# Patient Record
Sex: Female | Born: 1965 | Race: Black or African American | Hispanic: No | Marital: Single | Smoking: Never smoker
Health system: Southern US, Community
[De-identification: ages and names within clinical notes are randomized; demographics above are authoritative.]

## PROBLEM LIST (undated history)

## (undated) DIAGNOSIS — E119 Type 2 diabetes mellitus without complications: Secondary | ICD-10-CM

## (undated) DIAGNOSIS — I10 Essential (primary) hypertension: Secondary | ICD-10-CM

## (undated) DIAGNOSIS — E039 Hypothyroidism, unspecified: Secondary | ICD-10-CM

## (undated) HISTORY — DX: Type 2 diabetes mellitus without complications: E11.9

## (undated) HISTORY — DX: Essential (primary) hypertension: I10

## (undated) HISTORY — DX: Hypothyroidism, unspecified: E03.9

---

## 1997-11-14 ENCOUNTER — Other Ambulatory Visit: Admission: RE | Admit: 1997-11-14 | Discharge: 1997-11-14 | Payer: Self-pay | Admitting: Internal Medicine

## 1999-06-24 ENCOUNTER — Encounter: Admission: RE | Admit: 1999-06-24 | Discharge: 1999-06-24 | Payer: Self-pay | Admitting: Internal Medicine

## 1999-06-24 ENCOUNTER — Encounter: Payer: Self-pay | Admitting: Internal Medicine

## 2000-02-18 ENCOUNTER — Other Ambulatory Visit: Admission: RE | Admit: 2000-02-18 | Discharge: 2000-02-18 | Payer: Self-pay | Admitting: Internal Medicine

## 2004-09-07 ENCOUNTER — Encounter: Admission: RE | Admit: 2004-09-07 | Discharge: 2004-09-07 | Payer: Self-pay | Admitting: Internal Medicine

## 2004-09-21 ENCOUNTER — Encounter: Admission: RE | Admit: 2004-09-21 | Discharge: 2004-09-21 | Payer: Self-pay | Admitting: Internal Medicine

## 2005-09-23 ENCOUNTER — Encounter: Admission: RE | Admit: 2005-09-23 | Discharge: 2005-09-23 | Payer: Self-pay | Admitting: Internal Medicine

## 2006-09-27 ENCOUNTER — Encounter: Admission: RE | Admit: 2006-09-27 | Discharge: 2006-09-27 | Payer: Self-pay | Admitting: Internal Medicine

## 2007-10-02 ENCOUNTER — Encounter: Admission: RE | Admit: 2007-10-02 | Discharge: 2007-10-02 | Payer: Self-pay | Admitting: Internal Medicine

## 2008-10-03 ENCOUNTER — Encounter: Admission: RE | Admit: 2008-10-03 | Discharge: 2008-10-03 | Payer: Self-pay | Admitting: Internal Medicine

## 2009-08-25 ENCOUNTER — Encounter (HOSPITAL_COMMUNITY): Admission: RE | Admit: 2009-08-25 | Discharge: 2009-11-23 | Payer: Self-pay | Admitting: Internal Medicine

## 2009-08-26 ENCOUNTER — Ambulatory Visit (HOSPITAL_COMMUNITY): Admission: RE | Admit: 2009-08-26 | Discharge: 2009-08-26 | Payer: Self-pay | Admitting: Specialist

## 2009-10-29 ENCOUNTER — Ambulatory Visit: Payer: Self-pay | Admitting: Obstetrics and Gynecology

## 2010-02-06 ENCOUNTER — Encounter: Admission: RE | Admit: 2010-02-06 | Discharge: 2010-02-06 | Payer: Self-pay | Admitting: Specialist

## 2010-08-14 ENCOUNTER — Ambulatory Visit (HOSPITAL_COMMUNITY)
Admission: RE | Admit: 2010-08-14 | Discharge: 2010-08-14 | Payer: Self-pay | Source: Home / Self Care | Attending: Specialist | Admitting: Specialist

## 2010-08-30 ENCOUNTER — Encounter: Payer: Self-pay | Admitting: Specialist

## 2010-12-31 ENCOUNTER — Ambulatory Visit: Payer: Self-pay | Admitting: Advanced Practice Midwife

## 2010-12-31 ENCOUNTER — Ambulatory Visit: Payer: Medicare Other | Admitting: Physician Assistant

## 2010-12-31 ENCOUNTER — Other Ambulatory Visit: Payer: Self-pay | Admitting: Obstetrics and Gynecology

## 2010-12-31 ENCOUNTER — Other Ambulatory Visit: Payer: Self-pay | Admitting: Physician Assistant

## 2010-12-31 DIAGNOSIS — E079 Disorder of thyroid, unspecified: Secondary | ICD-10-CM

## 2010-12-31 DIAGNOSIS — Z1231 Encounter for screening mammogram for malignant neoplasm of breast: Secondary | ICD-10-CM

## 2010-12-31 DIAGNOSIS — Z01419 Encounter for gynecological examination (general) (routine) without abnormal findings: Secondary | ICD-10-CM

## 2011-01-01 NOTE — Group Therapy Note (Signed)
Rachel Michael, Rachel Michael               ACCOUNT NO.:  [id number]  MEDICAL RECORD NO.:  [id number]           PATIENT TYPE:  A  LOCATION:  [facility]                   FACILITY:  [facility]  PHYSICIAN:  Maylon Cos, CNM    DATE OF BIRTH:  1965-08-28  DATE OF SERVICE:  12/31/2010                                 CLINIC NOTE  The patient is being seen at GYN Clinic at [facility].  REASON FOR TODAY'S VISIT:  Annual exam.  HISTORY OF PRESENT ILLNESS:  The patient is a 45 year old nulligravida patient who presents as a new patient to the GYN Clinic.  She is mentally challenged and presents with no complaints.  She is a very poor historian and the best that I can conclude she is under the treatment of Dr. Kemper Durie as her primary care provider.  She does live at a group home. The patient does state that she has family history positive for breast cancer in her mother and her maternal aunt.  Her problem list includes: 1. Thyroid disease. 2. Mental illness.  PHYSICAL EXAMINATION:  GENERAL:  Rachel Michael is a pleasant African American female who is in no distress. HEENT:  Grossly normal except for poor dentition.  She has no thyromegaly. BREASTS:  Symmetrical without dimpling or retracting of the skin.  No masses or lesions externally on the skin.  Nipples are erect without discharge.  No palpable lesions or masses. ABDOMEN:  Soft and nontender.  No hepatosplenomegaly. GENITALIA:  She has a small introitus, smooth, and slightly atrophied. Vaginal mucosa without rugae.  She has moderate tone.  Scant amount of discharge without odor.  She has nulliparous cervix that is nonfriable. Bimanual exam is limited by patient habitus.  She has no cervical motion tenderness.  Uterus is not enlarged, nontender.  Adnexa not enlarged, nontender. LOWER EXTREMITIES:  Slightly edematous, however, nontender and are reactive.  ASSESSMENT: 1. Well-born exam. 2. Mental delays. 3. Thyroid disease.  PLAN: 1. Pap smear  was obtained and sent to pathology per routine. 2. Routine screening mammogram ordered along with DEXA scan. 3. The patient should follow up with her primary care provider Dr.     Kemper Durie per routine.  She does state that she has a physical exam     with him in June.  The patient shall return here 12 months for a     physical or sooner if need be. 4. We will attempt to obtain records from her either group home and/or     primary provider Dr. Kemper Durie to help shed light on her medical     history.          ______________________________ Maylon Cos, CNM    SS/MEDQ  D:  12/31/2010  T:  01/01/2011  Job:  [phone]

## 2011-01-12 ENCOUNTER — Ambulatory Visit (HOSPITAL_COMMUNITY): Admission: RE | Admit: 2011-01-12 | Payer: Medicare Other | Source: Ambulatory Visit

## 2011-01-12 ENCOUNTER — Ambulatory Visit (HOSPITAL_COMMUNITY): Payer: Medicare Other | Attending: Obstetrics and Gynecology

## 2011-02-23 ENCOUNTER — Other Ambulatory Visit: Payer: Self-pay | Admitting: Physician Assistant

## 2011-02-23 ENCOUNTER — Other Ambulatory Visit: Payer: Self-pay | Admitting: Obstetrics and Gynecology

## 2011-02-23 ENCOUNTER — Ambulatory Visit (HOSPITAL_COMMUNITY)
Admission: RE | Admit: 2011-02-23 | Discharge: 2011-02-23 | Disposition: A | Discharge status: Home / Self Care | Payer: Medicare Other | Source: Ambulatory Visit | Attending: Obstetrics and Gynecology | Admitting: Obstetrics and Gynecology

## 2011-02-23 DIAGNOSIS — E079 Disorder of thyroid, unspecified: Secondary | ICD-10-CM

## 2011-02-23 DIAGNOSIS — Z13820 Encounter for screening for osteoporosis: Secondary | ICD-10-CM | POA: Insufficient documentation

## 2011-11-26 ENCOUNTER — Other Ambulatory Visit: Payer: Self-pay | Admitting: Specialist

## 2011-11-26 DIAGNOSIS — Z1231 Encounter for screening mammogram for malignant neoplasm of breast: Secondary | ICD-10-CM

## 2011-12-17 ENCOUNTER — Ambulatory Visit
Admission: RE | Admit: 2011-12-17 | Discharge: 2011-12-17 | Disposition: A | Discharge status: Home / Self Care | Payer: Medicare Other | Source: Ambulatory Visit | Attending: Specialist | Admitting: Specialist

## 2011-12-17 DIAGNOSIS — Z1231 Encounter for screening mammogram for malignant neoplasm of breast: Secondary | ICD-10-CM

## 2012-12-11 ENCOUNTER — Other Ambulatory Visit: Payer: Self-pay | Admitting: Family Medicine

## 2012-12-11 DIAGNOSIS — Z1231 Encounter for screening mammogram for malignant neoplasm of breast: Secondary | ICD-10-CM

## 2013-01-16 ENCOUNTER — Ambulatory Visit: Payer: Medicare Other

## 2013-05-02 ENCOUNTER — Other Ambulatory Visit: Payer: Self-pay | Admitting: Obstetrics and Gynecology

## 2013-05-11 ENCOUNTER — Other Ambulatory Visit: Payer: Self-pay | Admitting: Obstetrics and Gynecology

## 2013-07-23 ENCOUNTER — Ambulatory Visit: Payer: Medicare Other

## 2013-08-28 ENCOUNTER — Ambulatory Visit
Admission: RE | Admit: 2013-08-28 | Discharge: 2013-08-28 | Disposition: A | Discharge status: Home / Self Care | Payer: Medicare Other | Source: Ambulatory Visit | Attending: Family Medicine | Admitting: Family Medicine

## 2013-08-28 DIAGNOSIS — Z1231 Encounter for screening mammogram for malignant neoplasm of breast: Secondary | ICD-10-CM

## 2014-10-15 ENCOUNTER — Other Ambulatory Visit: Payer: Self-pay

## 2014-10-15 DIAGNOSIS — Z1231 Encounter for screening mammogram for malignant neoplasm of breast: Secondary | ICD-10-CM

## 2014-10-31 ENCOUNTER — Ambulatory Visit: Payer: Medicare Other

## 2014-11-04 ENCOUNTER — Ambulatory Visit
Admission: RE | Admit: 2014-11-04 | Discharge: 2014-11-04 | Disposition: A | Discharge status: Home / Self Care | Payer: Medicare Other | Source: Ambulatory Visit

## 2014-11-04 DIAGNOSIS — Z1231 Encounter for screening mammogram for malignant neoplasm of breast: Secondary | ICD-10-CM

## 2016-02-17 ENCOUNTER — Other Ambulatory Visit: Payer: Self-pay | Admitting: Family Medicine

## 2016-02-17 DIAGNOSIS — Z1231 Encounter for screening mammogram for malignant neoplasm of breast: Secondary | ICD-10-CM

## 2016-03-15 ENCOUNTER — Ambulatory Visit: Payer: Medicare Other

## 2016-03-24 ENCOUNTER — Ambulatory Visit
Admission: RE | Admit: 2016-03-24 | Discharge: 2016-03-24 | Disposition: A | Discharge status: Home / Self Care | Payer: Medicare Other | Source: Ambulatory Visit | Attending: Family Medicine | Admitting: Family Medicine

## 2016-03-24 DIAGNOSIS — Z1231 Encounter for screening mammogram for malignant neoplasm of breast: Secondary | ICD-10-CM

## 2016-09-27 ENCOUNTER — Other Ambulatory Visit: Payer: Self-pay | Admitting: Family Medicine

## 2016-09-27 DIAGNOSIS — Z1231 Encounter for screening mammogram for malignant neoplasm of breast: Secondary | ICD-10-CM

## 2017-03-30 ENCOUNTER — Ambulatory Visit: Payer: Medicare Other

## 2017-04-14 ENCOUNTER — Ambulatory Visit
Admission: RE | Admit: 2017-04-14 | Discharge: 2017-04-14 | Disposition: A | Discharge status: Home / Self Care | Payer: Medicare Other | Source: Ambulatory Visit | Attending: Family Medicine | Admitting: Family Medicine

## 2017-04-14 DIAGNOSIS — Z1231 Encounter for screening mammogram for malignant neoplasm of breast: Secondary | ICD-10-CM

## 2017-12-29 ENCOUNTER — Encounter: Payer: Self-pay | Admitting: Family Medicine

## 2018-03-13 ENCOUNTER — Other Ambulatory Visit: Payer: Self-pay | Admitting: Family Medicine

## 2018-03-13 ENCOUNTER — Other Ambulatory Visit: Payer: Self-pay | Admitting: Physician Assistant

## 2018-03-13 DIAGNOSIS — Z1231 Encounter for screening mammogram for malignant neoplasm of breast: Secondary | ICD-10-CM

## 2018-04-17 ENCOUNTER — Ambulatory Visit
Admission: RE | Admit: 2018-04-17 | Discharge: 2018-04-17 | Disposition: A | Discharge status: Home / Self Care | Payer: Medicare Other | Source: Ambulatory Visit | Attending: Physician Assistant | Admitting: Physician Assistant

## 2018-04-17 DIAGNOSIS — Z1231 Encounter for screening mammogram for malignant neoplasm of breast: Secondary | ICD-10-CM

## 2019-02-14 ENCOUNTER — Other Ambulatory Visit: Payer: Self-pay | Admitting: Physician Assistant

## 2019-02-14 DIAGNOSIS — Z1231 Encounter for screening mammogram for malignant neoplasm of breast: Secondary | ICD-10-CM

## 2019-04-12 ENCOUNTER — Other Ambulatory Visit: Payer: Self-pay

## 2019-04-12 ENCOUNTER — Ambulatory Visit: Payer: Medicare Other | Attending: Sports Medicine | Admitting: Occupational Therapy

## 2019-04-12 DIAGNOSIS — M79642 Pain in left hand: Secondary | ICD-10-CM | POA: Insufficient documentation

## 2019-04-12 DIAGNOSIS — M6281 Muscle weakness (generalized): Secondary | ICD-10-CM

## 2019-04-12 DIAGNOSIS — M25642 Stiffness of left hand, not elsewhere classified: Secondary | ICD-10-CM | POA: Diagnosis not present

## 2019-04-12 DIAGNOSIS — R278 Other lack of coordination: Secondary | ICD-10-CM | POA: Insufficient documentation

## 2019-04-12 NOTE — Therapy (Signed)
[facility] [address] [place], [place], [address] Phone: [phone]   Fax:  [phone]  Occupational Therapy Evaluation  Patient Details  Name: Rachel Michael MRN: [id number] Date of Birth: [birth year] Referring Provider (OT): Dr. Pati GalloJames Kramer   Encounter Date: 04/12/2019  OT End of Session - 04/12/19 1525    Visit Number  1    Number of Visits  9    Date for OT Re-Evaluation  05/12/19    Authorization Type  MCR/MCD    Authorization - Visit Number  1    Authorization - Number of Visits  10    OT Start Time  1145    OT Stop Time  1230    OT Time Calculation (min)  45 min    Activity Tolerance  Patient tolerated treatment well    Behavior During Therapy  [facility] for tasks assessed/performed       No past medical history on file.  No past surgical history on file.  There were no vitals filed for this visit.  Subjective Assessment - 04/12/19 1154    Pertinent History  Lt 5th mallet finger injury (with splinting) May 2020. PMH: Mild MR, DM, HTN, anemia    Limitations  none at this time    Currently in Pain?  Yes    Pain Location  --   small finger   Pain Orientation  Left    Pain Descriptors / Indicators  Aching    Pain Type  Acute pain    Pain Onset  More than a month ago    Pain Frequency  Intermittent    Aggravating Factors   at night    Pain Relieving Factors  massage, heat        [facility] OT Assessment - 04/12/19 0001      Assessment   Medical Diagnosis  Lt 5th mallet finger    Referring Provider (OT)  Dr. Pati GalloJames Kramer    Onset Date/Surgical Date  --   May 2020   Hand Dominance  Left   for writing     Precautions   Precautions  None      Balance Screen   Has the patient fallen in the past 6 months  Yes    How many times?  1    Has the patient had a decrease in activity level because of a fear of falling?   No    Is the patient reluctant to leave their home because of a fear of falling?   No       Home  Environment   Additional Comments  Pt lives on 2nd floor apartment (Independent Living)    Lives With  Alone      Prior Function   Level of Independence  Independent    Vocation  Volunteer work   at [employer] - takes [employer]     ADL   Eating/Feeding  Modified independent   with Rt hand (always)    Grooming  Modified independent    Upper Body Bathing  Modified independent    Lower Body Bathing  Modified independent    Upper Body Dressing  Independent    Lower Body Dressing  Modified independent    Toilet Transfer  Modified independent    Tub/Shower Transfer  Modified independent   question safety - may need grab bar or shower chair     IADL   Shopping  Needs to be accompanied on any shopping trip  Light Housekeeping  Does personal laundry completely;Performs light daily tasks such as dishwashing, bed making    Meal Prep  Able to complete simple warm meal prep;Able to complete simple cold meal and snack prep    Teacher, adult education independently on public transportation   SCAT, has personal aide come 1x/wk for groceries   Medication Management  Is responsible for taking medication in correct dosages at correct time   but question accuracy w/ this      Mobility   Mobility Status  Independent      Written Expression   Dominant Hand  Left   for writing only   Handwriting  Increased time   90% legibility d/t decr ring & small finger ROM     Vision - History   Baseline Vision  Wears glasses all the time      Cognition   Cognition Comments  Pt with mild MR (per MD note)       Observation/Other Assessments   Observations  Lt small finger at rest: PIP hyperextension and DIP flexion 30*       Sensation   Additional Comments  appears intact      Coordination   9 Hole Peg Test  --    Coordination  Pt can pick up pen and turn around in Lt hand b/t first 3 fingers. Pt unable to perform full in hand manipulation Lt hand hand due to decreased ring and small  finger ROM      Edema   Edema  none      ROM / Strength   AROM / PROM / Strength  AROM      AROM   Overall AROM Comments  BUE AROM WNL's except Lt ring and small finger - see below for details      Left Hand AROM   L Ring PIP 0-100  80 Degrees    L Ring DIP 0-70  35 Degrees    L Little PIP 0-100  40 Degrees   hyperextension at rest   L Little DIP 0-70  65 Degrees   -30 ext     Hand Function   Right Hand Grip (lbs)  37 lbs    Left Hand Grip (lbs)  19 lbs                           OT Long Term Goals - 04/12/19 1530      OT LONG TERM GOAL #1   Title  Independent with HEP for Lt hand    Time  4    Period  Weeks    Status  New      OT LONG TERM GOAL #2   Title  Pt to write name with greater ease/legibility using A/E prn    Time  4    Period  Weeks    Status  New      OT LONG TERM GOAL #3   Title  Grip strength to improve by 10 lbs or greater Lt hand    Baseline  19 lbs (Rt = 37 lbs)    Time  4    Period  Weeks    Status  New      OT LONG TERM GOAL #4   Title  Pt to improve PIP flexion Lt ring and small fingers by 10* or greater for greater composite flexion for gripping activities    Baseline  ring PIP flex = 80*, small  PIP flex = 40*    Time  4    Period  Weeks    Status  New      OT LONG TERM GOAL #5   Title  Pt to verbalize understanding with acquisition of DME for safety with bathing    Time  4    Period  Weeks    Status  New            Plan - 04/12/19 1526    Clinical Impression Statement  Pt is a 53 y.o. female who presents to outpatient rehab s/p Lt 5th digit mallet finger injury in May 2020. Pt did not have surgery but had splinting prior to this O.T. evaluation. MD referral states she does not need any further splinting at this time. Pt demo Lt small finger PIP hyperextension and DIP flexion in 30 degrees. Pt also very stiff in ring and small finger Lt hand and would benefit from O.T. to increase ROM, strength, and functional  use of Lt hand.    Occupational performance deficits (Please refer to evaluation for details):  ADL's;IADL's    Body Structure / Function / Physical Skills  ADL;IADL;ROM;Sensation;Body mechanics;Strength;Coordination;Pain;UE functional use    Rehab Potential  Good    Clinical Decision Making  Limited treatment options, no task modification necessary    Comorbidities Affecting Occupational Performance:  Presence of comorbidities impacting occupational performance    Comorbidities impacting occupational performance description:  MR    Modification or Assistance to Complete Evaluation   Min-Moderate modification of tasks or assist with assess necessary to complete eval    OT Frequency  2x / week    OT Duration  4 weeks   PLUS EVAL   OT Treatment/Interventions  Self-care/ADL training;Therapeutic exercise;Functional Mobility Training;Manual Therapy;Splinting;Therapeutic activities;DME and/or AE instruction;Paraffin;Electrical Stimulation;Fluidtherapy;Moist Heat;Contrast Bath;Passive range of motion;Patient/family education    Plan  Paraffin, HEP including P/ROM and putty    Consulted and Agree with Plan of Care  Patient       Patient will benefit from skilled therapeutic intervention in order to improve the following deficits and impairments:   Body Structure / Function / Physical Skills: ADL, IADL, ROM, Sensation, Body mechanics, Strength, Coordination, Pain, UE functional use       Visit Diagnosis: Stiffness of left hand, not elsewhere classified  Pain in left hand  Muscle weakness (generalized)  Other lack of coordination    Problem List There are no active problems to display for this patient.   Carey Bullocks, OTR/L 04/12/2019, 6:56 PM  [facility] [address] [place], [place], [address] Phone: [phone]   Fax:  [phone]  Name: Rachel Michael MRN: [id number] Date of Birth: [birth year]

## 2019-04-19 ENCOUNTER — Other Ambulatory Visit: Payer: Self-pay

## 2019-04-19 ENCOUNTER — Ambulatory Visit
Admission: RE | Admit: 2019-04-19 | Discharge: 2019-04-19 | Disposition: A | Discharge status: Home / Self Care | Payer: Medicare Other | Source: Ambulatory Visit | Attending: Physician Assistant | Admitting: Physician Assistant

## 2019-04-19 DIAGNOSIS — Z1231 Encounter for screening mammogram for malignant neoplasm of breast: Secondary | ICD-10-CM

## 2019-04-24 ENCOUNTER — Ambulatory Visit: Payer: Medicare Other | Admitting: Occupational Therapy

## 2019-04-24 ENCOUNTER — Other Ambulatory Visit: Payer: Self-pay

## 2019-04-24 DIAGNOSIS — M79642 Pain in left hand: Secondary | ICD-10-CM

## 2019-04-24 DIAGNOSIS — M6281 Muscle weakness (generalized): Secondary | ICD-10-CM

## 2019-04-24 DIAGNOSIS — M25642 Stiffness of left hand, not elsewhere classified: Secondary | ICD-10-CM

## 2019-04-24 NOTE — Patient Instructions (Signed)
   1. PIP Flexion (Active)    Keeping large knuckles straight, bend the middle joint of all fingers, as far as possible. Hold __3__ seconds. Repeat __10__ times. Do __6__ sessions per day.  2. PIP / DIP Composite Flexion (Passive Stretch)    Use other hand to bend middle and tip joints of ___small___ finger, and also ring finger. Hold _10___ seconds. Repeat __5__ times. Do __6__ sessions per day.   3.  Pen rolling exercise (use High lighter): roll high lighter down to palm and back up x 5 reps. Perform 6x/day    1. Grip Strengthening (Resistive Putty)   Squeeze putty using thumb and all fingers. Repeat _20___ times. Do __2__ sessions per day.   2.  IP Fisting (Resistive Putty)    Keeping big knuckles straight, bend fingertips only to squeeze putty. Keep pushing putty up towards fingertips between each repetition Repeat __10__ times. Do _2___ sessions per day.

## 2019-04-24 NOTE — Therapy (Signed)
[facility] [address] [place], [place], [address] Phone: [phone]   Fax:  [phone]  Occupational Therapy Treatment  Patient Details  Name: Rachel Michael MRN: [id number] Date of Birth: 1966/05/07 Referring Provider (OT): Dr. Pati GalloJames Kramer   Encounter Date: 04/24/2019  OT End of Session - 04/24/19 0842    Visit Number  2    Number of Visits  9    Date for OT Re-Evaluation  05/12/19    Authorization Type  MCR/MCD    Authorization - Visit Number  2    Authorization - Number of Visits  10    OT Start Time  0800    OT Stop Time  0840    OT Time Calculation (min)  40 min    Activity Tolerance  Patient tolerated treatment well    Behavior During Therapy  [facility] for tasks assessed/performed       No past medical history on file.  No past surgical history on file.  There were no vitals filed for this visit.  Subjective Assessment - 04/24/19 0809    Subjective   I forgot to bring my list of medications    Pertinent History  Lt 5th mallet finger injury (with splinting) May 2020. PMH: *Mild MR, DM, HTN, anemia    Limitations  none at this time. MD reports no need for splinting at this time as pt has already had splinting    Currently in Pain?  Yes    Pain Score  --   fluctuates   Pain Location  --   small finger   Pain Orientation  Left    Pain Descriptors / Indicators  Aching    Pain Type  Acute pain    Pain Onset  More than a month ago    Pain Frequency  Intermittent    Aggravating Factors   at night    Pain Relieving Factors  massage, heat                   OT Treatments/Exercises (OP) - 04/24/19 0001      Exercises   Exercises  Hand      Hand Exercises   Other Hand Exercises  Pt issued A/ROM, P/ROM, and putty HEP - see pt instructions for details. Pt issued yellow putty      Modalities   Modalities  Paraffin      LUE Paraffin   Number Minutes Paraffin  10 Minutes    LUE Paraffin  Location  Hand;Wrist             OT Education - 04/24/19 0835    Education Details  A/ROM, P/ROM and putty HEP for Lt hand/small finger    Person(s) Educated  Patient    Methods  Explanation;Demonstration;Handout;Verbal cues    Comprehension  Verbalized understanding;Returned demonstration;Verbal cues required;Need further instruction          OT Long Term Goals - 04/24/19 0845      OT LONG TERM GOAL #1   Title  Independent with HEP for Lt hand    Time  4    Period  Weeks    Status  On-going      OT LONG TERM GOAL #2   Title  Pt to write name with greater ease/legibility using A/E prn    Time  4    Period  Weeks    Status  New      OT LONG TERM GOAL #3  Title  Grip strength to improve by 10 lbs or greater Lt hand    Baseline  19 lbs (Rt = 37 lbs)    Time  4    Period  Weeks    Status  New      OT LONG TERM GOAL #4   Title  Pt to improve PIP flexion Lt ring and small fingers by 10* or greater for greater composite flexion for gripping activities    Baseline  ring PIP flex = 80*, small PIP flex = 40*    Time  4    Period  Weeks    Status  New      OT LONG TERM GOAL #5   Title  Pt to verbalize understanding with acquisition of DME for safety with bathing    Time  4    Period  Weeks    Status  New            Plan - 04/24/19 1209    Clinical Impression Statement  Pt progressing with LTG #1 - pt will need review    Occupational performance deficits (Please refer to evaluation for details):  ADL's;IADL's    Body Structure / Function / Physical Skills  ADL;IADL;ROM;Sensation;Body mechanics;Strength;Coordination;Pain;UE functional use    Rehab Potential  Good    Comorbidities impacting occupational performance description:  MR    OT Frequency  2x / week    OT Duration  4 weeks    OT Treatment/Interventions  Self-care/ADL training;Therapeutic exercise;Functional Mobility Training;Manual Therapy;Splinting;Therapeutic activities;DME and/or AE  instruction;Paraffin;Electrical Stimulation;Fluidtherapy;Moist Heat;Contrast Bath;Passive range of motion;Patient/family education    Plan  paraffin, review HEP, update med list if pt brings in, continue ROM and strength Lt hand/small finger    Consulted and Agree with Plan of Care  Patient       Patient will benefit from skilled therapeutic intervention in order to improve the following deficits and impairments:   Body Structure / Function / Physical Skills: ADL, IADL, ROM, Sensation, Body mechanics, Strength, Coordination, Pain, UE functional use       Visit Diagnosis: Stiffness of left hand, not elsewhere classified  Pain in left hand  Muscle weakness (generalized)    Problem List There are no active problems to display for this patient.   Carey Bullocks, OTR/L 04/24/2019, 12:11 PM  [facility] [address] [place], [place], [address] Phone: [phone]   Fax:  [phone]  Name: Rachel Michael MRN: [id number] Date of Birth: 1965/12/02

## 2019-04-26 ENCOUNTER — Ambulatory Visit: Payer: Medicare Other | Admitting: Occupational Therapy

## 2019-04-26 ENCOUNTER — Other Ambulatory Visit: Payer: Self-pay

## 2019-04-26 DIAGNOSIS — M25642 Stiffness of left hand, not elsewhere classified: Secondary | ICD-10-CM

## 2019-04-26 DIAGNOSIS — M79642 Pain in left hand: Secondary | ICD-10-CM

## 2019-04-26 DIAGNOSIS — M6281 Muscle weakness (generalized): Secondary | ICD-10-CM

## 2019-04-26 NOTE — Therapy (Signed)
[facility] [address] [place], [place], [address] Phone: [phone]   Fax:  [phone]  Occupational Therapy Treatment  Patient Details  Name: Rachel Michael MRN: [id number] Date of Birth: 12-28-1965 Referring Provider (OT): Dr. Berle Mull   Encounter Date: 04/26/2019  OT End of Session - 04/26/19 1603    Visit Number  3    Number of Visits  9    Date for OT Re-Evaluation  05/12/19    Authorization Type  MCR/MCD    Authorization - Visit Number  3    Authorization - Number of Visits  10    OT Start Time  5427    OT Stop Time  1630    OT Time Calculation (min)  40 min    Activity Tolerance  Patient tolerated treatment well    Behavior During Therapy  [facility] for tasks assessed/performed       No past medical history on file.  No past surgical history on file.  There were no vitals filed for this visit.  Subjective Assessment - 04/26/19 1600    Subjective   I brought in my medications today    Pertinent History  Lt 5th mallet finger injury (with splinting) May 2020. PMH: *Mild MR, DM, HTN, anemia    Limitations  none at this time. MD reports no need for splinting at this time as pt has already had splinting    Currently in Pain?  Yes    Pain Score  --   Fluctuates (no pain at rest)   Pain Location  --   small finger   Pain Orientation  Left    Pain Descriptors / Indicators  Aching    Pain Type  Acute pain    Pain Onset  More than a month ago    Pain Frequency  Intermittent    Aggravating Factors   at night    Pain Relieving Factors  massage, heat       Paraffin x 10 min. Lt hand (while pt in paraffin, therapist updated meds in Epic) Reviewed HEP - pt issued buddy strap to wear for first ex due to small finger abducted. Pt could achieve greater small finger flexion w/ it buddy strapped to ring finger. P/ROM to small and ring finger in flexion followed by place and hold ex's Finger flex/curling ex using  small towel - crinkling towel                          OT Long Term Goals - 04/26/19 1636      OT LONG TERM GOAL #1   Title  Independent with HEP for Lt hand    Time  4    Period  Weeks    Status  Achieved      OT LONG TERM GOAL #2   Title  Pt to write name with greater ease/legibility using A/E prn    Time  4    Period  Weeks    Status  New      OT LONG TERM GOAL #3   Title  Grip strength to improve by 10 lbs or greater Lt hand    Baseline  19 lbs (Rt = 37 lbs)    Time  4    Period  Weeks    Status  On-going      OT LONG TERM GOAL #4   Title  Pt to improve PIP flexion Lt ring and  small fingers by 10* or greater for greater composite flexion for gripping activities    Baseline  ring PIP flex = 80*, small PIP flex = 40*    Time  4    Period  Weeks    Status  On-going      OT LONG TERM GOAL #5   Title  Pt to verbalize understanding with acquisition of DME for safety with bathing    Time  4    Period  Weeks    Status  New            Plan - 04/26/19 1637    Clinical Impression Statement  Pt met LTG #1 today. Pt progressing w/ ROM    Occupational performance deficits (Please refer to evaluation for details):  ADL's;IADL's    Body Structure / Function / Physical Skills  ADL;IADL;ROM;Sensation;Body mechanics;Strength;Coordination;Pain;UE functional use    Rehab Potential  Good    Comorbidities impacting occupational performance description:  Mild MR    OT Frequency  2x / week    OT Duration  4 weeks    OT Treatment/Interventions  Self-care/ADL training;Therapeutic exercise;Functional Mobility Training;Manual Therapy;Splinting;Therapeutic activities;DME and/or AE instruction;Paraffin;Electrical Stimulation;Fluidtherapy;Moist Heat;Contrast Bath;Passive range of motion;Patient/family education    Plan  fluidotherapy, work towards remaining goals    Consulted and Agree with Plan of Care  Patient       Patient will benefit from skilled therapeutic  intervention in order to improve the following deficits and impairments:   Body Structure / Function / Physical Skills: ADL, IADL, ROM, Sensation, Body mechanics, Strength, Coordination, Pain, UE functional use       Visit Diagnosis: Stiffness of left hand, not elsewhere classified  Pain in left hand  Muscle weakness (generalized)    Problem List There are no active problems to display for this patient.   Carey Bullocks, OTR/L 04/26/2019, 4:38 PM  [facility] [address] [place], [place], [address] Phone: [phone]   Fax:  [phone]  Name: [name] MRN: [id number] Date of Birth: Jan 14, 1966

## 2019-05-01 ENCOUNTER — Other Ambulatory Visit: Payer: Self-pay

## 2019-05-01 ENCOUNTER — Ambulatory Visit: Payer: Medicare Other | Admitting: Occupational Therapy

## 2019-05-01 DIAGNOSIS — R278 Other lack of coordination: Secondary | ICD-10-CM

## 2019-05-01 DIAGNOSIS — M25642 Stiffness of left hand, not elsewhere classified: Secondary | ICD-10-CM

## 2019-05-01 DIAGNOSIS — M6281 Muscle weakness (generalized): Secondary | ICD-10-CM

## 2019-05-01 NOTE — Therapy (Signed)
[facility] [address] [place], [place], [address] Phone: [phone]   Fax:  [phone]  Occupational Therapy Treatment  Patient Details  Name: Rachel Michael MRN: [id number] Date of Birth: July 21, 1966 Referring Provider (OT): Dr. Berle Mull   Encounter Date: 05/01/2019  OT End of Session - 05/01/19 0849    Visit Number  4    Number of Visits  9    Date for OT Re-Evaluation  05/12/19    Authorization Type  MCR/MCD    Authorization - Visit Number  4    Authorization - Number of Visits  10    OT Start Time  0800    OT Stop Time  0845    OT Time Calculation (min)  45 min    Activity Tolerance  Patient tolerated treatment well    Behavior During Therapy  [facility] for tasks assessed/performed       No past medical history on file.  No past surgical history on file.  There were no vitals filed for this visit.  Subjective Assessment - 05/01/19 0804    Subjective   I use a Cpap at night    Pertinent History  Lt 5th mallet finger injury (with splinting) May 2020. PMH: *Mild MR, DM, HTN, anemia    Limitations  none at this time. MD reports no need for splinting at this time as pt has already had splinting    Currently in Pain?  Yes    Pain Score  --   fluctuates   Pain Location  --   small finger   Pain Orientation  Left    Pain Descriptors / Indicators  Aching    Pain Onset  More than a month ago    Pain Frequency  Intermittent    Aggravating Factors   cold weather    Pain Relieving Factors  massage, heat                   OT Treatments/Exercises (OP) - 05/01/19 0001      ADLs   Writing  Practiced writing name with and without built up pen - no difference b/t the two. Pt then practiced writing address w/ regular pen - pt slow but 100% legible (premorbid due to mild MR). Pt did have trouble w/ ring and small finger staying flexed for writing    ADL Comments  Pt issued tub transfer bench info  (including picture) and prescription for MD to sign/date - pt instructed to take to medical supply store once signed to get tub bench      Hand Exercises   Other Hand Exercises  Gripper set at level 1 resistance to pick up blocks Lt hand for ROM and sustained grip strength      Modalities   Modalities  Fluidotherapy      LUE Fluidotherapy   Number Minutes Fluidotherapy  12 Minutes    LUE Fluidotherapy Location  Hand    Comments  at beginning of session to decrease pain             OT Education - 05/01/19 0850    Education Details  tub transfer bench info    Person(s) Educated  Patient    Methods  Explanation;Handout    Comprehension  Verbalized understanding          OT Long Term Goals - 05/01/19 0849      OT LONG TERM GOAL #1   Title  Independent with HEP for Lt  hand    Time  4    Period  Weeks    Status  Achieved      OT LONG TERM GOAL #2   Title  Pt to write name with greater ease/legibility using A/E prn    Time  4    Period  Weeks    Status  Achieved      OT LONG TERM GOAL #3   Title  Grip strength to improve by 10 lbs or greater Lt hand    Baseline  19 lbs (Rt = 37 lbs)    Time  4    Period  Weeks    Status  On-going      OT LONG TERM GOAL #4   Title  Pt to improve PIP flexion Lt ring and small fingers by 10* or greater for greater composite flexion for gripping activities    Baseline  ring PIP flex = 80*, small PIP flex = 40*    Time  4    Period  Weeks    Status  On-going      OT LONG TERM GOAL #5   Title  Pt to verbalize understanding with acquisition of DME for safety with bathing    Time  4    Period  Weeks    Status  Achieved            Plan - 05/01/19 0850    Clinical Impression Statement  Pt has met 3/5 LTG's as of date and progressing with remaining goals.    Occupational performance deficits (Please refer to evaluation for details):  ADL's;IADL's    Body Structure / Function / Physical Skills  ADL;IADL;ROM;Sensation;Body  mechanics;Strength;Coordination;Pain;UE functional use    Rehab Potential  Good    OT Frequency  2x / week    OT Duration  4 weeks    OT Treatment/Interventions  Self-care/ADL training;Therapeutic exercise;Functional Mobility Training;Manual Therapy;Splinting;Therapeutic activities;DME and/or AE instruction;Paraffin;Electrical Stimulation;Fluidtherapy;Moist Heat;Contrast Bath;Passive range of motion;Patient/family education    Plan  fluidotherapy, work towards remaining goals    Consulted and Agree with Plan of Care  Patient       Patient will benefit from skilled therapeutic intervention in order to improve the following deficits and impairments:   Body Structure / Function / Physical Skills: ADL, IADL, ROM, Sensation, Body mechanics, Strength, Coordination, Pain, UE functional use       Visit Diagnosis: Stiffness of left hand, not elsewhere classified  Muscle weakness (generalized)  Other lack of coordination    Problem List There are no active problems to display for this patient.   Carey Bullocks, OTR/L 05/01/2019, 8:52 AM  [facility] [address] [place], [place], [address] Phone: [phone]   Fax:  [phone]  Name: Rachel Michael MRN: [id number] Date of Birth: November 02, 1965

## 2019-05-03 ENCOUNTER — Other Ambulatory Visit: Payer: Self-pay

## 2019-05-03 ENCOUNTER — Encounter: Payer: Self-pay | Admitting: Occupational Therapy

## 2019-05-03 ENCOUNTER — Ambulatory Visit: Payer: Medicare Other | Admitting: Occupational Therapy

## 2019-05-03 DIAGNOSIS — M6281 Muscle weakness (generalized): Secondary | ICD-10-CM

## 2019-05-03 DIAGNOSIS — M79642 Pain in left hand: Secondary | ICD-10-CM

## 2019-05-03 DIAGNOSIS — M25642 Stiffness of left hand, not elsewhere classified: Secondary | ICD-10-CM | POA: Diagnosis not present

## 2019-05-03 DIAGNOSIS — R278 Other lack of coordination: Secondary | ICD-10-CM

## 2019-05-03 NOTE — Therapy (Signed)
[facility] [address] [place], [place], [address] Phone: [phone]   Fax:  [phone]  Occupational Therapy Treatment  Patient Details  Name: Rachel Michael MRN: [id number] Date of Birth: [birth year] Referring Provider (OT): Dr. Pati Gallo   Encounter Date: 05/03/2019  OT End of Session - 05/03/19 1702    Visit Number  5    Number of Visits  9    Date for OT Re-Evaluation  05/12/19    Authorization Type  MCR/MCD    Authorization - Visit Number  5    Authorization - Number of Visits  10    OT Start Time  1405    OT Stop Time  1448    OT Time Calculation (min)  43 min    Activity Tolerance  Patient tolerated treatment well    Behavior During Therapy  [facility] for tasks assessed/performed       History reviewed. No pertinent past medical history.  History reviewed. No pertinent surgical history.  There were no vitals filed for this visit.  Subjective Assessment - 05/03/19 1612    Subjective   My finger was a little sore last night, I had to shake it out    Pertinent History  Lt 5th mallet finger injury (with splinting) May 2020. PMH: *Mild MR, DM, HTN, anemia    Limitations  none at this time. MD reports no need for splinting at this time as pt has already had splinting    Currently in Pain?  Yes    Pain Score  10-Worst pain ever    Pain Location  Finger (Comment which one)    Pain Orientation  Left    Pain Descriptors / Indicators  Sharp    Pain Type  Acute pain    Pain Onset  More than a month ago    Pain Frequency  Intermittent    Aggravating Factors   cold    Pain Relieving Factors  ice                   OT Treatments/Exercises (OP) - 05/03/19 0001      Exercises   Exercises  Hand      Hand Exercises   PIPJ Flexion  Left;AROM;PROM;Strengthening    Hand Gripper with Large Beads  On level 2 for 5 blocks at a time with rest between.  Needs cueing for sustained contraction.  Then 25 + at  level 1 resistance without dropping.      Other Hand Exercises  towel grips with buddy strap 4th and 5th digits.  Patient able to touch pad to palm with 4th and 5th digit.  Needed ueing for proper strapping and tension.        LUE Fluidotherapy   Number Minutes Fluidotherapy  12 Minutes    LUE Fluidotherapy Location  Hand    Comments  at beginning of session to reduce pain             OT Education - 05/03/19 1701    Education Details  buddy strap tension and application    Person(s) Educated  Patient    Methods  Explanation    Comprehension  Verbalized understanding          OT Long Term Goals - 05/03/19 1642      OT LONG TERM GOAL #1   Title  Independent with HEP for Lt hand    Period  Weeks    Status  Achieved  OT LONG TERM GOAL #2   Title  Pt to write name with greater ease/legibility using A/E prn    Period  Weeks    Status  Achieved      OT LONG TERM GOAL #3   Title  Grip strength to improve by 10 lbs or greater Lt hand    Baseline  19 lbs (Rt = 37 lbs)    Time  4    Period  Weeks    Status  On-going      OT LONG TERM GOAL #4   Title  Pt to improve PIP flexion Lt ring and small fingers by 10* or greater for greater composite flexion for gripping activities    Baseline  09/24- 5th digit PIP 75, 4TH digit PIP 80 flex    Period  Weeks    Status  Achieved      OT LONG TERM GOAL #5   Title  Pt to verbalize understanding with acquisition of DME for safety with bathing    Period  Weeks    Status  Achieved            Plan - 05/03/19 1707    Clinical Impression Statement  Patient is completing her HEP, and continues to show progress with range of motion and strength in left hand    OT Frequency  2x / week    OT Duration  4 weeks    OT Treatment/Interventions  Self-care/ADL training;Therapeutic exercise;Functional Mobility Training;Manual Therapy;Splinting;Therapeutic activities;DME and/or AE instruction;Paraffin;Electrical  Stimulation;Fluidtherapy;Moist Heat;Contrast Bath;Passive range of motion;Patient/family education    Plan  fluidotherapy, work towards remaining goal    Consulted and Agree with Plan of Care  Patient       Patient will benefit from skilled therapeutic intervention in order to improve the following deficits and impairments:           Visit Diagnosis: Stiffness of left hand, not elsewhere classified  Muscle weakness (generalized)  Other lack of coordination  Pain in left hand    Problem List There are no active problems to display for this patient.   Mariah Milling, OTR/L 05/03/2019, 5:11 PM  [facility] [address] [place], [place], [address] Phone: [phone]   Fax:  [phone]  Name: Rachel Michael MRN: [id number] Date of Birth: 1966/07/12

## 2019-05-08 ENCOUNTER — Other Ambulatory Visit: Payer: Self-pay

## 2019-05-08 ENCOUNTER — Ambulatory Visit: Payer: Medicare Other | Admitting: Occupational Therapy

## 2019-05-08 DIAGNOSIS — M6281 Muscle weakness (generalized): Secondary | ICD-10-CM

## 2019-05-08 DIAGNOSIS — M25642 Stiffness of left hand, not elsewhere classified: Secondary | ICD-10-CM

## 2019-05-08 DIAGNOSIS — M79642 Pain in left hand: Secondary | ICD-10-CM

## 2019-05-08 NOTE — Therapy (Signed)
[facility] [address] [place], [place], [address] Phone: [phone]   Fax:  [phone]  Occupational Therapy Treatment  Patient Details  Name: Rachel Michael MRN: [id number] Date of Birth: 04/27/1966 Referring Provider (OT): Dr. Berle Mull   Encounter Date: 05/08/2019  OT End of Session - 05/08/19 0851    Visit Number  6    Number of Visits  9    Date for OT Re-Evaluation  05/12/19    Authorization Type  MCR/MCD    Authorization - Visit Number  6    Authorization - Number of Visits  10    OT Start Time  0820    OT Stop Time  0905    OT Time Calculation (min)  45 min    Activity Tolerance  Patient tolerated treatment well    Behavior During Therapy  [facility] for tasks assessed/performed       No past medical history on file.  No past surgical history on file.  There were no vitals filed for this visit.  Subjective Assessment - 05/08/19 0838    Subjective   I had pain in my finger this past weekend; I had to shake it out    Pertinent History  Lt 5th mallet finger injury (with splinting) May 2020. PMH: *Mild MR, DM, HTN, anemia    Limitations  none at this time. MD reports no need for splinting at this time as pt has already had splinting    Currently in Pain?  Yes    Pain Score  5     Pain Location  --   small finger   Pain Orientation  Left    Pain Type  Acute pain    Pain Onset  More than a month ago    Pain Frequency  Intermittent    Aggravating Factors   cold    Pain Relieving Factors  heat        Fluidotherapy x 12 min. Lt hand to decr stiffness/pain P/ROM followed by place and hold ex's for Lt small finger flexion and composite flexion.  Also worked on coming up from full fist into isolated IP flexion before full extension.  Gripper set at level 1 resistance to pick up blocks for sustained grip strength w/ min drops and cues to focus on sustaining grip. Pt improved after cueing Towel scrunching  ex for PIP flex (w/ review of proper donning of buddy strap prior to ex)                        OT Long Term Goals - 05/03/19 1642      OT LONG TERM GOAL #1   Title  Independent with HEP for Lt hand    Period  Weeks    Status  Achieved      OT LONG TERM GOAL #2   Title  Pt to write name with greater ease/legibility using A/E prn    Period  Weeks    Status  Achieved      OT LONG TERM GOAL #3   Title  Grip strength to improve by 10 lbs or greater Lt hand    Baseline  19 lbs (Rt = 37 lbs)    Time  4    Period  Weeks    Status  On-going      OT LONG TERM GOAL #4   Title  Pt to improve PIP flexion Lt ring and small fingers by  10* or greater for greater composite flexion for gripping activities    Baseline  09/24- 5th digit PIP 75, 4TH digit PIP 80 flex    Period  Weeks    Status  Achieved      OT LONG TERM GOAL #5   Title  Pt to verbalize understanding with acquisition of DME for safety with bathing    Period  Weeks    Status  Achieved            Plan - 05/08/19 0853    Clinical Impression Statement  Pt demo soreness today in small finger. Pt continuing to work on grip strength and small finger flexion as she is inconsistent touching palm of hand w/ small finger    Occupational performance deficits (Please refer to evaluation for details):  ADL's;IADL's    Body Structure / Function / Physical Skills  ADL;IADL;ROM;Sensation;Body mechanics;Strength;Coordination;Pain;UE functional use    Rehab Potential  Good    Comorbidities impacting occupational performance description:  Mild MR    OT Frequency  2x / week    OT Duration  4 weeks    OT Treatment/Interventions  Self-care/ADL training;Therapeutic exercise;Functional Mobility Training;Manual Therapy;Splinting;Therapeutic activities;DME and/or AE instruction;Paraffin;Electrical Stimulation;Fluidtherapy;Moist Heat;Contrast Bath;Passive range of motion;Patient/family education    Plan  continue grip strength,  assess remaining goal and d/c next session    Consulted and Agree with Plan of Care  Patient       Patient will benefit from skilled therapeutic intervention in order to improve the following deficits and impairments:   Body Structure / Function / Physical Skills: ADL, IADL, ROM, Sensation, Body mechanics, Strength, Coordination, Pain, UE functional use       Visit Diagnosis: Stiffness of left hand, not elsewhere classified  Muscle weakness (generalized)  Pain in left hand    Problem List There are no active problems to display for this patient.   Kelli Churn, OTR/L 05/08/2019, 9:21 AM  [facility] [address] [place], [place], [address] Phone: [phone]   Fax:  [phone]  Name: Rachel Michael MRN: [id number] Date of Birth: Apr 10, 1966

## 2019-05-10 ENCOUNTER — Ambulatory Visit: Payer: Medicare Other | Admitting: Occupational Therapy

## 2019-05-15 ENCOUNTER — Ambulatory Visit: Payer: Medicare Other | Admitting: Occupational Therapy

## 2019-05-21 ENCOUNTER — Ambulatory Visit: Payer: Medicare Other | Admitting: Occupational Therapy

## 2019-05-28 ENCOUNTER — Ambulatory Visit: Payer: Medicare Other | Admitting: Occupational Therapy

## 2019-06-06 ENCOUNTER — Encounter: Payer: Self-pay | Admitting: *Deleted

## 2019-07-02 ENCOUNTER — Telehealth: Payer: Self-pay

## 2019-07-02 NOTE — Telephone Encounter (Signed)
Patient called to reschedule her uopcoming appointment. Forward message to the front desk to get patient rescheduled.

## 2019-07-04 ENCOUNTER — Ambulatory Visit: Payer: Medicare Other | Admitting: Obstetrics and Gynecology

## 2019-07-31 ENCOUNTER — Encounter: Payer: Medicare Other | Admitting: Advanced Practice Midwife

## 2019-09-06 ENCOUNTER — Encounter: Payer: Medicare Other | Admitting: Obstetrics and Gynecology

## 2019-10-03 ENCOUNTER — Ambulatory Visit (INDEPENDENT_AMBULATORY_CARE_PROVIDER_SITE_OTHER): Payer: Medicare Other | Admitting: Obstetrics & Gynecology

## 2019-10-03 ENCOUNTER — Other Ambulatory Visit: Payer: Self-pay

## 2019-10-03 ENCOUNTER — Other Ambulatory Visit (HOSPITAL_COMMUNITY)
Admission: RE | Admit: 2019-10-03 | Discharge: 2019-10-03 | Disposition: A | Discharge status: Home / Self Care | Payer: Medicare Other | Source: Ambulatory Visit | Attending: Obstetrics & Gynecology | Admitting: Obstetrics & Gynecology

## 2019-10-03 ENCOUNTER — Encounter: Payer: Self-pay | Admitting: Obstetrics & Gynecology

## 2019-10-03 VITALS — BP 146/87 | HR 91 | Wt 277.7 lb

## 2019-10-03 DIAGNOSIS — N924 Excessive bleeding in the premenopausal period: Secondary | ICD-10-CM

## 2019-10-03 DIAGNOSIS — Z01411 Encounter for gynecological examination (general) (routine) with abnormal findings: Secondary | ICD-10-CM

## 2019-10-03 DIAGNOSIS — N888 Other specified noninflammatory disorders of cervix uteri: Secondary | ICD-10-CM

## 2019-10-03 DIAGNOSIS — N889 Noninflammatory disorder of cervix uteri, unspecified: Secondary | ICD-10-CM

## 2019-10-03 DIAGNOSIS — Z1151 Encounter for screening for human papillomavirus (HPV): Secondary | ICD-10-CM | POA: Diagnosis not present

## 2019-10-03 DIAGNOSIS — Z01419 Encounter for gynecological examination (general) (routine) without abnormal findings: Secondary | ICD-10-CM | POA: Insufficient documentation

## 2019-10-03 NOTE — Progress Notes (Signed)
GYNECOLOGY ANNUAL PREVENTATIVE CARE ENCOUNTER NOTE  History:     Rachel Michael is a 54 y.o. female here for a routine annual gynecologic exam, has not been seen since 12/2010.  Current complaints: none.  Patient is a poor historian.  Currently having some bleeding, unable to say how long she has been bleeding. "I bleed for a few days all the time".  Has never gone for a long time without bleeding.   Denies abnormal vaginal discharge, pelvic pain, problems with intercourse or other gynecologic concerns.    Gynecologic History No LMP recorded. Contraception: none Last Pap: 2012. Results were: normal Last mammogram: 04/19/2019. Results were: normal  Obstetric History OB History  No obstetric history on file.    Past Medical History:  Diagnosis Date  . HTN (hypertension)   . Hypothyroidism   . Type 2 diabetes mellitus ([facility])     History reviewed. No pertinent surgical history.  Current Outpatient Medications on File Prior to Visit  Medication Sig Dispense Refill  . atorvastatin (LIPITOR) 10 MG tablet Take 10 mg by mouth daily.    . ferrous sulfate 325 (65 FE) MG tablet Take 325 mg by mouth daily with breakfast.    . levothyroxine (SYNTHROID) 100 MCG tablet Take 100 mcg by mouth daily before breakfast.    . metFORMIN (GLUCOPHAGE) 500 MG tablet Take by mouth 2 (two) times daily with a meal.    . triamterene-hydrochlorothiazide (MAXZIDE) 75-50 MG tablet Take 1 tablet by mouth daily.     No current facility-administered medications on file prior to visit.    No Known Allergies  Social History:  reports that she has never smoked. She has never used smokeless tobacco. She reports that she does not drink alcohol or use drugs.  History reviewed. No pertinent family history.  The following portions of the patient's history were reviewed and updated as appropriate: allergies, current medications, past family history, past medical history, past social history, past surgical history and  problem list.  Review of Systems Pertinent items noted in HPI and remainder of comprehensive ROS otherwise negative.  Physical Exam:  BP (!) 146/87   Pulse 91   Wt 277 lb 11.2 oz (126 kg)  CONSTITUTIONAL: Well-developed, well-nourished female in no acute distress.  HENT:  Normocephalic, atraumatic, External right and left ear normal. Oropharynx is clear and moist EYES: Conjunctivae and EOM are normal. Pupils are equal, round, and reactive to light. No scleral icterus.  NECK: Normal range of motion, supple, no masses.  Normal thyroid.  SKIN: Skin is warm and dry. No rash noted. Not diaphoretic. No erythema. No pallor. MUSCULOSKELETAL: Normal range of motion. No tenderness.  No cyanosis, clubbing, or edema.   NEUROLOGIC: Alert and oriented to person, place, and time. Normal reflexes, muscle tone coordination. No cranial nerve deficit noted. PSYCHIATRIC: Normal mood and affect. Normal behavior. Normal judgment and thought content. CARDIOVASCULAR: Normal heart rate noted, regular rhythm RESPIRATORY: Clear to auscultation bilaterally. Effort and breath sounds normal, no problems with respiration noted. BREASTS: Symmetric in size. No masses, tenderness, skin changes, nipple drainage, or lymphadenopathy bilaterally. ABDOMEN: Soft, obese, no distention appreciated.  No tenderness, rebound or guarding.  PELVIC: Normal appearing external genitalia and urethral meatus; normal appearing vaginal mucosa.  Had to use long Pederson speculum to visualize cervix, it was very short and flush with vaginal wall, also deviated to the left (patient denies any cervical procedures or history of fibroids or other issues). Bloody discharge noted. Pap smear obtained.  Unable  to palpate uterus or adnexa secondary to habitus.    Assessment and Plan:      1. Abnormal appearance of cervix 2. Abnormal perimenopausal bleeding Will obtain pelvic ultrasound given unusual examination findings and possible abnormal  perimenopausal bleeding.  Will follow up results and manage accordingly. Bleeding precautions reviewed.  - US PELVIC COMPLETE WITH TRANSVAGINAL; Future  3. Well woman exam with routine gynecological exam - Cytology - PAP Will follow up results of pap smear and manage accordingly. Mammogram and colonoscopy is up-to-date Routine preventative health maintenance measures emphasized. Please refer to After Visit Summary for other counseling recommendations.      Jaynie Collins, MD, FACOG Obstetrician & Gynecologist, [facility] for [employer], [facility]

## 2019-10-03 NOTE — Patient Instructions (Signed)

## 2019-10-05 ENCOUNTER — Other Ambulatory Visit: Payer: Self-pay | Admitting: General Practice

## 2019-10-05 LAB — CYTOLOGY - PAP
Comment: NEGATIVE
Diagnosis: NEGATIVE
High risk HPV: NEGATIVE

## 2019-10-18 ENCOUNTER — Telehealth: Payer: Self-pay | Admitting: Obstetrics & Gynecology

## 2019-10-18 NOTE — Telephone Encounter (Signed)
Attempted to contact patient with her ultrasound appointment. No answer, left voicemail with the appointment information ( 3/18 @ 11:00, arrive 10:45 w/ full bladder). Patient instructed to give the office a call back if needing to reschedule.

## 2019-10-25 ENCOUNTER — Ambulatory Visit (HOSPITAL_COMMUNITY): Admission: RE | Admit: 2019-10-25 | Payer: Medicare Other | Source: Ambulatory Visit

## 2020-03-10 ENCOUNTER — Other Ambulatory Visit: Payer: Self-pay | Admitting: Physician Assistant

## 2020-03-10 DIAGNOSIS — Z1231 Encounter for screening mammogram for malignant neoplasm of breast: Secondary | ICD-10-CM

## 2020-04-21 ENCOUNTER — Other Ambulatory Visit: Payer: Self-pay

## 2020-04-21 ENCOUNTER — Ambulatory Visit
Admission: RE | Admit: 2020-04-21 | Discharge: 2020-04-21 | Disposition: A | Discharge status: Home / Self Care | Payer: Medicare Other | Source: Ambulatory Visit | Attending: Physician Assistant | Admitting: Physician Assistant

## 2020-04-21 DIAGNOSIS — Z1231 Encounter for screening mammogram for malignant neoplasm of breast: Secondary | ICD-10-CM

## 2021-02-21 IMAGING — MG MM DIGITAL SCREENING BILAT W/ TOMO W/ CAD
8 series · 8 of 24 positions shown · non-contrast
Comparison: Previous exam(s).

ACR Breast Density Category a: The breast tissue is almost entirely
fatty.

CLINICAL DATA: Screening.

EXAM:
DIGITAL SCREENING BILATERAL MAMMOGRAM WITH TOMO AND CAD

[L MLO synth-2D]
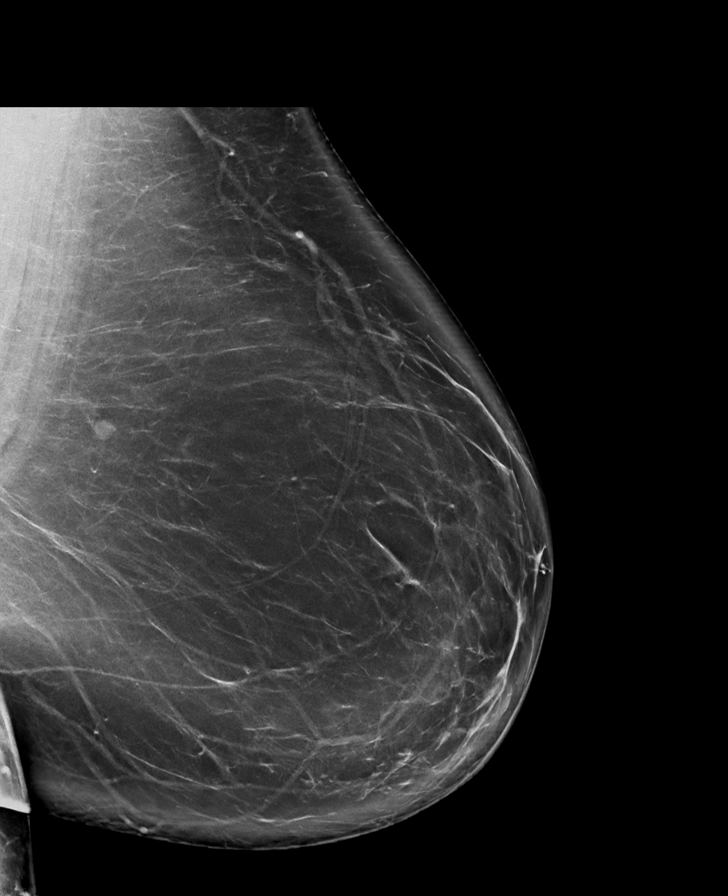

[L CC synth-2D]
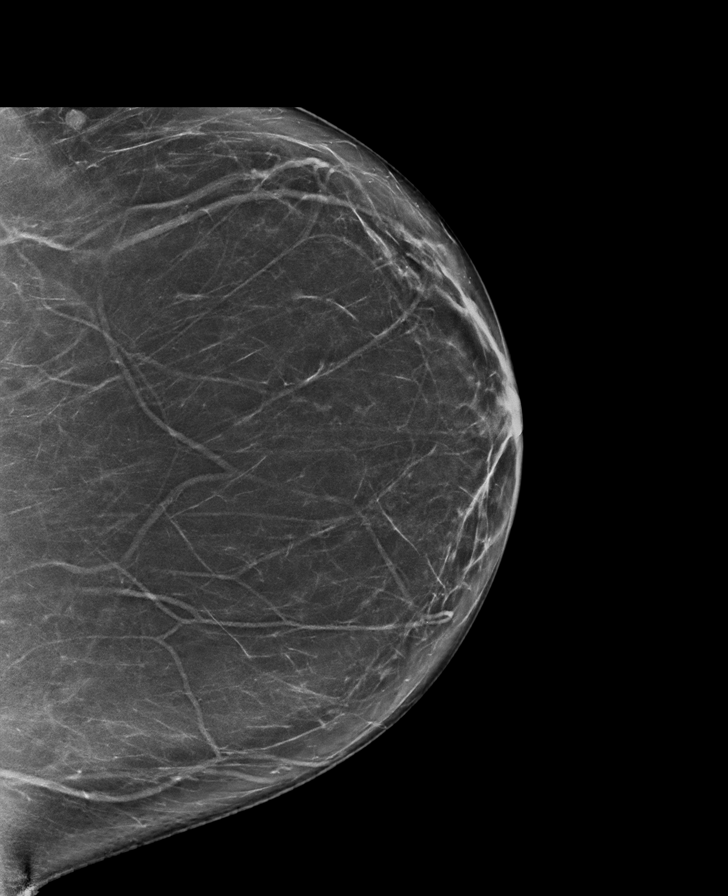

[R CC synth-2D]
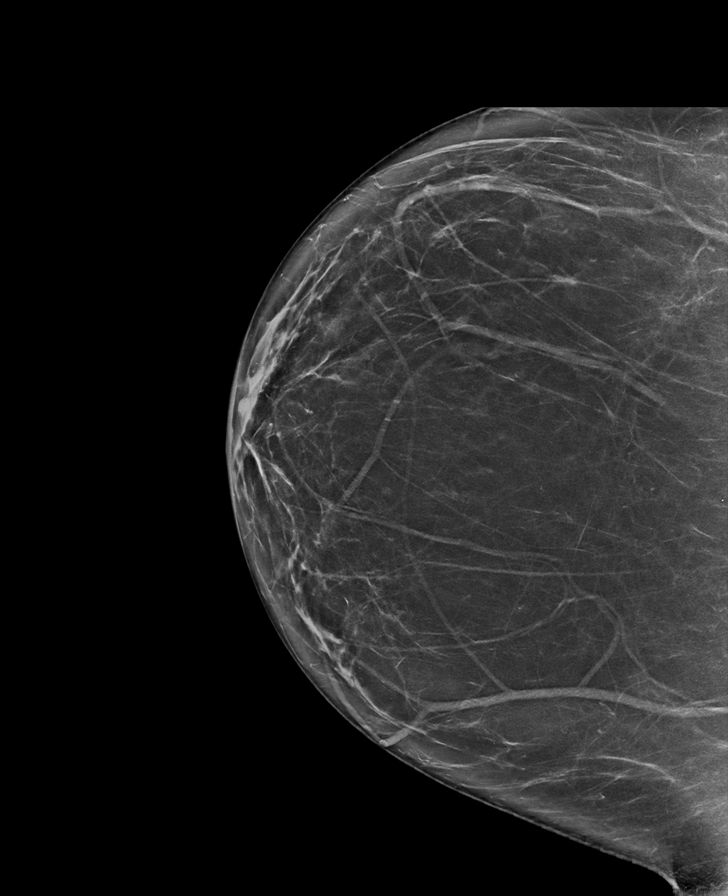

[R MLO synth-2D]
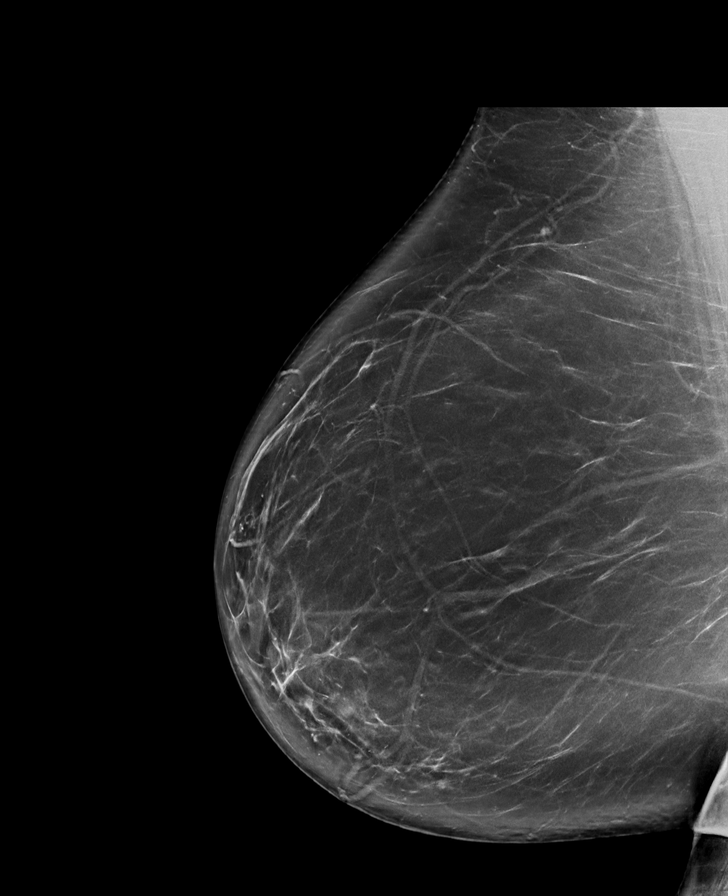

[R CC tomo · tomo slice 42/83.0]
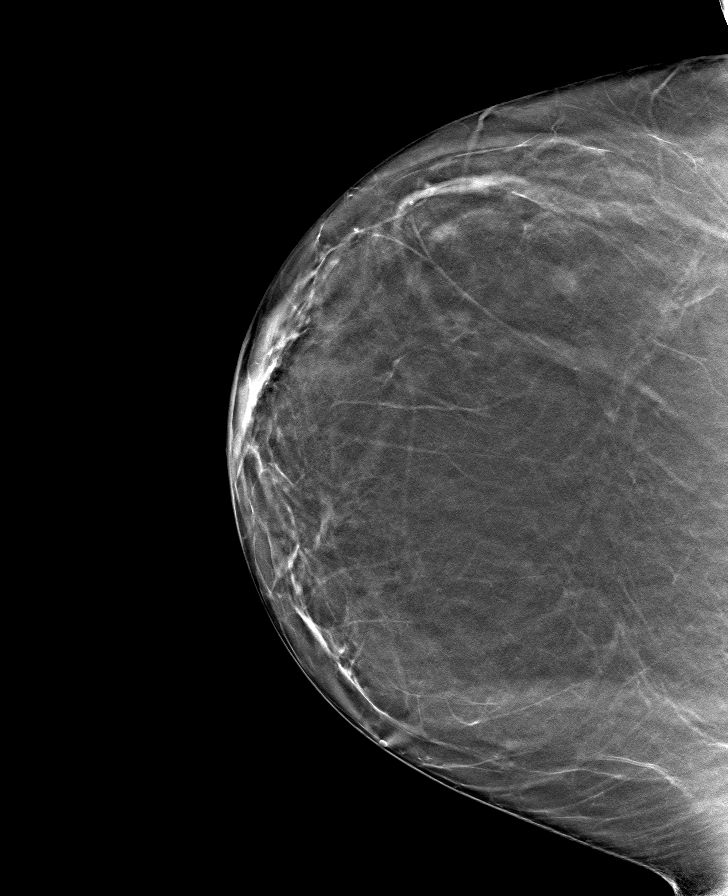

[R MLO tomo · tomo slice 53/104.0]
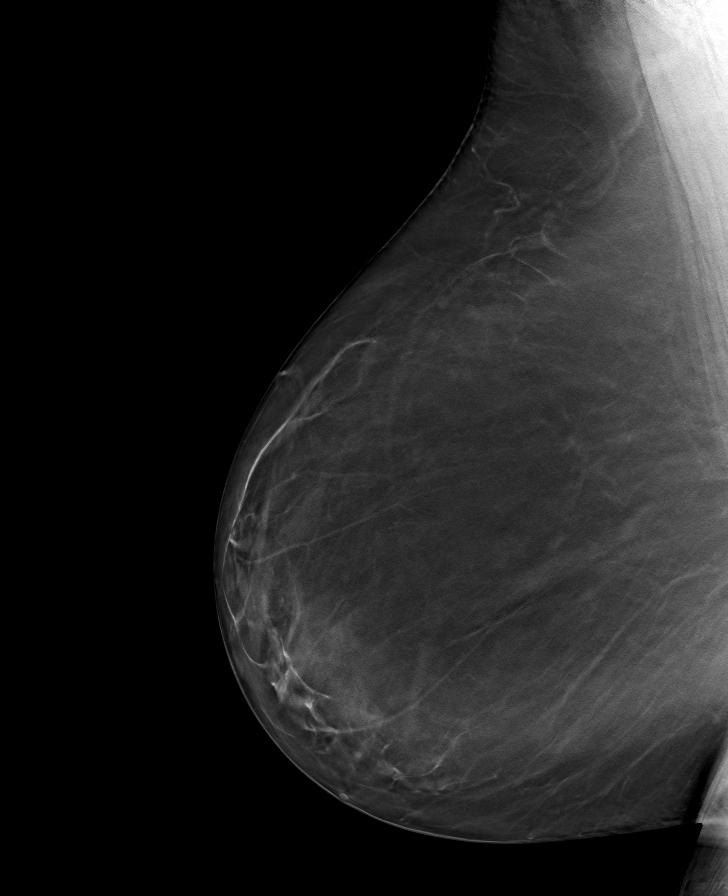

[L MLO tomo · tomo slice 55/110.0]
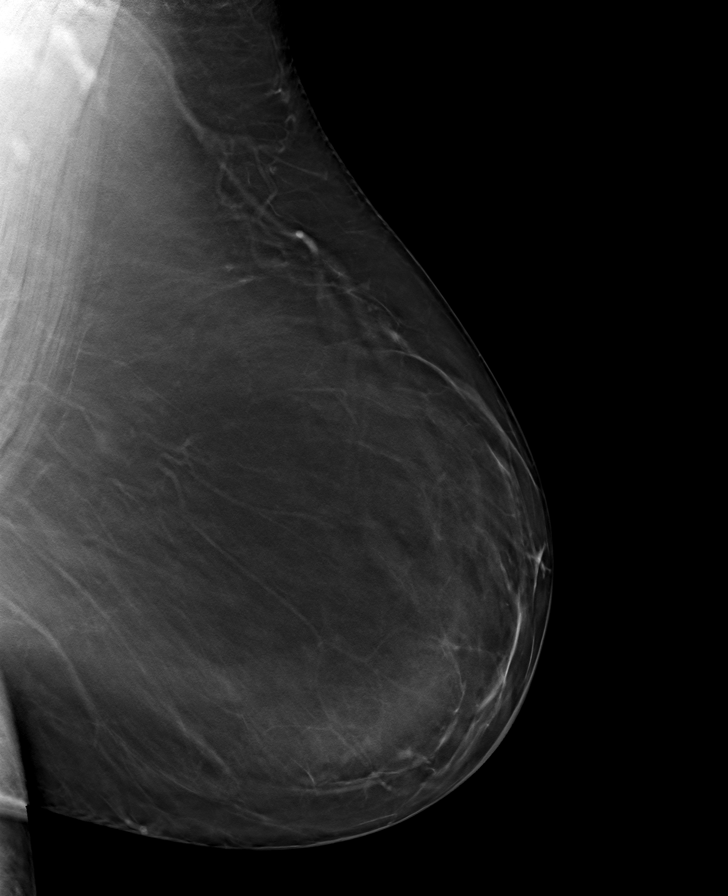

[L CC tomo · tomo slice 45/90.0]
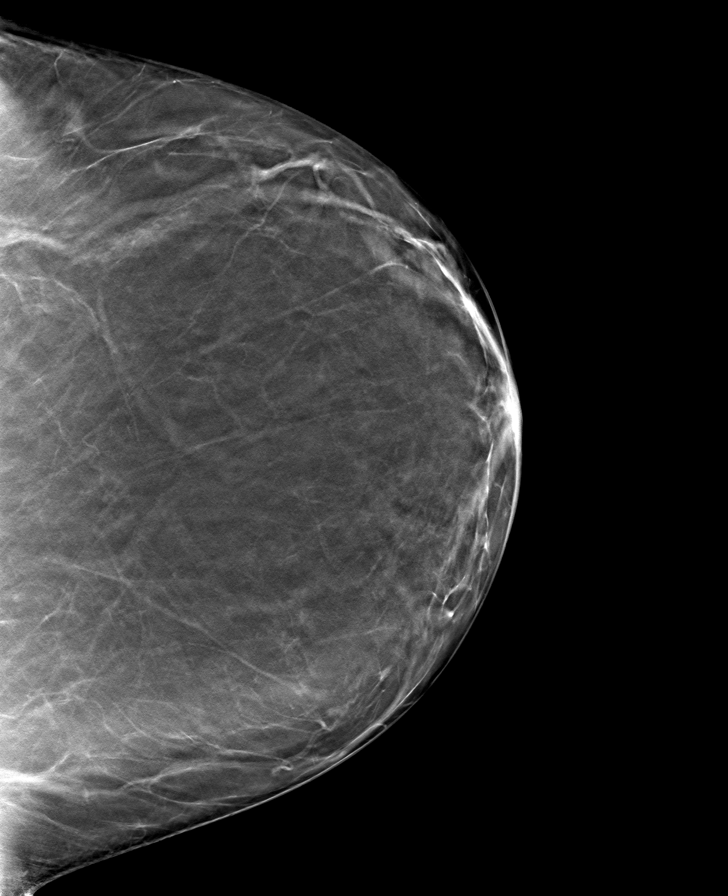

[8 of 24 positions shown; findings below may reference images not displayed]

FINDINGS: There are no findings suspicious for malignancy. Images were
processed with CAD.
IMPRESSION: No mammographic evidence of malignancy. A result letter of this
screening mammogram will be mailed directly to the patient.

RECOMMENDATION:
Screening mammogram in one year. (Code:[id number]-[id number])

BI-RADS CATEGORY  1: Negative.

## 2021-03-13 ENCOUNTER — Other Ambulatory Visit: Payer: Self-pay | Admitting: Physician Assistant

## 2021-03-13 DIAGNOSIS — Z1231 Encounter for screening mammogram for malignant neoplasm of breast: Secondary | ICD-10-CM

## 2021-05-05 ENCOUNTER — Ambulatory Visit: Payer: Medicare Other

## 2021-06-05 ENCOUNTER — Ambulatory Visit: Payer: Medicare Other

## 2021-06-09 ENCOUNTER — Ambulatory Visit
Admission: RE | Admit: 2021-06-09 | Discharge: 2021-06-09 | Disposition: A | Discharge status: Home / Self Care | Payer: Medicare Other | Source: Ambulatory Visit | Attending: Physician Assistant | Admitting: Physician Assistant

## 2021-06-09 ENCOUNTER — Other Ambulatory Visit: Payer: Self-pay

## 2021-06-09 DIAGNOSIS — Z1231 Encounter for screening mammogram for malignant neoplasm of breast: Secondary | ICD-10-CM

## 2021-10-22 ENCOUNTER — Encounter (HOSPITAL_BASED_OUTPATIENT_CLINIC_OR_DEPARTMENT_OTHER): Payer: Self-pay

## 2021-10-22 ENCOUNTER — Other Ambulatory Visit: Payer: Self-pay

## 2021-10-22 ENCOUNTER — Emergency Department (HOSPITAL_BASED_OUTPATIENT_CLINIC_OR_DEPARTMENT_OTHER)
Admission: EM | Admit: 2021-10-22 | Discharge: 2021-10-22 | Disposition: A | Discharge status: Home / Self Care | Payer: Medicare Other | Attending: Emergency Medicine | Admitting: Emergency Medicine

## 2021-10-22 DIAGNOSIS — E119 Type 2 diabetes mellitus without complications: Secondary | ICD-10-CM | POA: Insufficient documentation

## 2021-10-22 DIAGNOSIS — X111XXA Contact with running hot water, initial encounter: Secondary | ICD-10-CM | POA: Insufficient documentation

## 2021-10-22 DIAGNOSIS — Z7984 Long term (current) use of oral hypoglycemic drugs: Secondary | ICD-10-CM | POA: Insufficient documentation

## 2021-10-22 DIAGNOSIS — T2103XA Burn of unspecified degree of upper back, initial encounter: Secondary | ICD-10-CM | POA: Insufficient documentation

## 2021-10-22 LAB — CBG MONITORING, ED: Glucose-Capillary: 105 mg/dL — ABNORMAL HIGH (ref 70–99)

## 2021-10-22 NOTE — ED Triage Notes (Signed)
Burnt by hot water on Tuesday while getting hair wash.  Noted open blister area right upper back scapula area about 1 inch x 2 inch ?

## 2021-10-22 NOTE — Discharge Instructions (Addendum)
Clean the wound gently once daily and apply triple antibiotic ointment and a new dressing.  You can get the triple antibiotic ointment at your pharmacy.  Follow-up with your primary care doctor within a few days to have a recheck on the wound.  Return to the emergency room if you have any worsening symptoms including pussy drainage or increased redness around the area. ?

## 2021-10-22 NOTE — ED Provider Notes (Signed)
?[facility] ?Provider Note ? ? ?CSN: [id number] ?Arrival date & time: 10/22/21  4650 ? ?  ? ?History ? ?Chief Complaint  ?Patient presents with  ? Burn  ?  back  ? ? ?Rachel Michael is a 56 y.o. female. ? ?Patient is a 56 year old female who presents with a burn to her right upper back.  It happened on Tuesday which was 2 days ago from some hot water that was being used to wash her hair.  She said that it is recently opened up.  She denies any fevers.  No significant drainage from the area.  She is a diabetic and takes metformin for her diabetes. ? ? ?  ? ?Home Medications ?Prior to Admission medications   ?Medication Sig Start Date End Date Taking? Authorizing Provider  ?atorvastatin (LIPITOR) 10 MG tablet Take 10 mg by mouth daily.    [provider]  ?ferrous sulfate 325 (65 FE) MG tablet Take 325 mg by mouth daily with breakfast.    [provider]  ?levothyroxine (SYNTHROID) 100 MCG tablet Take 100 mcg by mouth daily before breakfast.    [provider]  ?metFORMIN (GLUCOPHAGE) 500 MG tablet Take by mouth 2 (two) times daily with a meal.    [provider]  ?triamterene-hydrochlorothiazide (MAXZIDE) 75-50 MG tablet Take 1 tablet by mouth daily.    [provider]  ?   ? ?Allergies    ?Patient has no known allergies.   ? ?Review of Systems   ?Review of Systems  ?Constitutional:  Negative for chills, diaphoresis, fatigue and fever.  ?HENT:  Positive for rhinorrhea. Negative for congestion and sneezing.   ?Eyes: Negative.   ?Respiratory:  Negative for cough, chest tightness and shortness of breath.   ?Cardiovascular:  Negative for chest pain and leg swelling.  ?Gastrointestinal:  Negative for abdominal pain, blood in stool, diarrhea, nausea and vomiting.  ?Genitourinary:  Negative for difficulty urinating, flank pain, frequency and hematuria.  ?Musculoskeletal:  Negative for arthralgias and back pain.  ?Skin:  Positive for wound. Negative for  rash.  ?Neurological:  Negative for dizziness, speech difficulty, weakness, numbness and headaches.  ? ?Physical Exam ?Updated Vital Signs ?BP (!) 134/117 (BP Location: Left Arm)   Pulse 88   Temp 98.5 ?F (36.9 ?C) (Oral)   Resp 18   Ht 5' (1.524 m)   Wt 122 kg   SpO2 100%   BMI 52.53 kg/m?  ?Physical Exam ?Constitutional:   ?   Appearance: She is well-developed.  ?HENT:  ?   Head: Normocephalic and atraumatic.  ?Eyes:  ?   Pupils: Pupils are equal, round, and reactive to light.  ?Cardiovascular:  ?   Rate and Rhythm: Normal rate and regular rhythm.  ?   Heart sounds: Normal heart sounds.  ?Pulmonary:  ?   Effort: Pulmonary effort is normal. No respiratory distress.  ?   Breath sounds: Normal breath sounds. No wheezing or rales.  ?Chest:  ?   Chest wall: No tenderness.  ?Abdominal:  ?   General: Bowel sounds are normal.  ?   Palpations: Abdomen is soft.  ?   Tenderness: There is no abdominal tenderness. There is no guarding or rebound.  ?Musculoskeletal:     ?   General: Normal range of motion.  ?   Cervical back: Normal range of motion and neck supple.  ?Lymphadenopathy:  ?   Cervical: No cervical adenopathy.  ?Skin: ?   General: Skin is warm and dry.  ?  Findings: No rash.  ?   Comments: Partial-thickness burn to her right upper back.  There is a small area that appears to previously had a blister that opened.  No surrounding redness or suggestions of infection.  ?Neurological:  ?   Mental Status: She is alert and oriented to person, place, and time.  ? ? ? ?ED Results / Procedures / Treatments   ?Labs ?(all labs ordered are listed, but only abnormal results are displayed) ?Labs Reviewed  ?CBG MONITORING, ED - Abnormal; Notable for the following components:  ?    Result Value  ? Glucose-Capillary 105 (*)   ? All other components within normal limits  ? ? ?EKG ?None ? ?Radiology ?No results found. ? ?Procedures ?Procedures  ? ? ?Medications Ordered in ED ?Medications - No data to display ? ?ED Course/  Medical Decision Making/ A&P ?  ?                        ?Medical Decision Making ? ?Patient is a 15 year old who presents with a burn to her upper back.  Peers to be partial-thickness.  There is no suggestions of infection currently.  Her glucose is 105.  Discussed with her wound care and she will use triple antibiotic ointment to the area.  Advised her to follow-up with her PCP within a few days for recheck.  Return precautions were given.  Her diastolic blood pressure is elevated in the ED.  We will recheck this prior to discharge but patient also need to have it rechecked by her PCP. ? ?Final Clinical Impression(s) / ED Diagnoses ?Final diagnoses:  ?Partial thickness burn of back, initial encounter  ? ? ?Rx / DC Orders ?ED Discharge Orders   ? ? None  ? ?  ? ? ?  ?Rolan Bucco, MD ?10/22/21 [phone] ? ?

## 2021-10-22 NOTE — ED Notes (Signed)
ED Provider at bedside. 

## 2021-10-22 NOTE — ED Notes (Signed)
Burn to back dressed with antibiotic ointment and then non adhered dressing.  Reviewed dressing with patient and gave verbal understanding of such. ?

## 2021-10-22 NOTE — ED Notes (Signed)
CBG 105 

## 2022-05-04 ENCOUNTER — Other Ambulatory Visit: Payer: Self-pay | Admitting: Nurse Practitioner

## 2022-05-04 DIAGNOSIS — Z1231 Encounter for screening mammogram for malignant neoplasm of breast: Secondary | ICD-10-CM

## 2022-06-10 ENCOUNTER — Ambulatory Visit: Payer: Medicare Other

## 2022-08-05 ENCOUNTER — Ambulatory Visit: Payer: Medicare Other

## 2022-08-26 ENCOUNTER — Ambulatory Visit: Payer: Medicare Other

## 2022-10-14 ENCOUNTER — Ambulatory Visit: Payer: Medicare Other

## 2023-02-17 ENCOUNTER — Ambulatory Visit: Payer: Medicare Other

## 2023-03-09 ENCOUNTER — Ambulatory Visit: Payer: 59

## 2023-03-24 ENCOUNTER — Ambulatory Visit: Payer: 59

## 2023-03-29 ENCOUNTER — Ambulatory Visit: Payer: 59

## 2023-03-29 ENCOUNTER — Ambulatory Visit: Admission: RE | Admit: 2023-03-29 | Payer: 59 | Source: Ambulatory Visit

## 2023-03-29 DIAGNOSIS — Z1231 Encounter for screening mammogram for malignant neoplasm of breast: Secondary | ICD-10-CM
